# Patient Record
Sex: Male | Born: 1975 | Race: White | Hispanic: No | Marital: Single | Smoking: Never smoker
Health system: Southern US, Community
[De-identification: ages and names within clinical notes are randomized; demographics above are authoritative.]

## PROBLEM LIST (undated history)

## (undated) DIAGNOSIS — R51 Headache: Secondary | ICD-10-CM

## (undated) DIAGNOSIS — R519 Headache, unspecified: Secondary | ICD-10-CM

## (undated) DIAGNOSIS — D181 Lymphangioma, any site: Secondary | ICD-10-CM

## (undated) HISTORY — PX: NO PAST SURGERIES: SHX2092

---
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
## 2010-10-12 ENCOUNTER — Emergency Department: Payer: Self-pay | Admitting: Emergency Medicine

## 2012-09-27 ENCOUNTER — Emergency Department: Payer: Self-pay | Admitting: Emergency Medicine

## 2012-09-27 LAB — RAPID INFLUENZA A&B ANTIGENS

## 2013-04-20 ENCOUNTER — Ambulatory Visit: Payer: Self-pay | Admitting: Pediatrics

## 2013-09-16 ENCOUNTER — Emergency Department: Payer: Self-pay | Admitting: Emergency Medicine

## 2014-01-27 ENCOUNTER — Emergency Department: Payer: Self-pay | Admitting: Emergency Medicine

## 2016-12-06 ENCOUNTER — Other Ambulatory Visit: Payer: Self-pay | Admitting: Orthopedic Surgery

## 2016-12-06 DIAGNOSIS — M5137 Other intervertebral disc degeneration, lumbosacral region: Secondary | ICD-10-CM

## 2016-12-06 DIAGNOSIS — M5417 Radiculopathy, lumbosacral region: Secondary | ICD-10-CM

## 2016-12-06 DIAGNOSIS — M5136 Other intervertebral disc degeneration, lumbar region: Secondary | ICD-10-CM

## 2016-12-06 DIAGNOSIS — M5440 Lumbago with sciatica, unspecified side: Principal | ICD-10-CM

## 2016-12-06 DIAGNOSIS — M545 Low back pain, unspecified: Secondary | ICD-10-CM

## 2016-12-17 ENCOUNTER — Ambulatory Visit
Admission: RE | Admit: 2016-12-17 | Discharge: 2016-12-17 | Disposition: A | Discharge status: Home / Self Care | Payer: BLUE CROSS/BLUE SHIELD | Source: Ambulatory Visit | Attending: Orthopedic Surgery | Admitting: Orthopedic Surgery

## 2016-12-17 DIAGNOSIS — M5136 Other intervertebral disc degeneration, lumbar region: Secondary | ICD-10-CM

## 2016-12-17 DIAGNOSIS — M5417 Radiculopathy, lumbosacral region: Secondary | ICD-10-CM

## 2016-12-17 DIAGNOSIS — M5440 Lumbago with sciatica, unspecified side: Secondary | ICD-10-CM | POA: Insufficient documentation

## 2016-12-17 DIAGNOSIS — M5137 Other intervertebral disc degeneration, lumbosacral region: Secondary | ICD-10-CM

## 2016-12-17 DIAGNOSIS — M545 Low back pain, unspecified: Secondary | ICD-10-CM

## 2017-01-01 ENCOUNTER — Other Ambulatory Visit: Payer: Self-pay | Admitting: Orthopedic Surgery

## 2017-01-01 DIAGNOSIS — M5127 Other intervertebral disc displacement, lumbosacral region: Secondary | ICD-10-CM

## 2017-01-08 ENCOUNTER — Ambulatory Visit
Admission: RE | Admit: 2017-01-08 | Discharge: 2017-01-08 | Disposition: A | Discharge status: Home / Self Care | Payer: BLUE CROSS/BLUE SHIELD | Source: Ambulatory Visit | Attending: Orthopedic Surgery | Admitting: Orthopedic Surgery

## 2017-01-08 DIAGNOSIS — M5127 Other intervertebral disc displacement, lumbosacral region: Secondary | ICD-10-CM

## 2017-01-08 MED ORDER — METHYLPREDNISOLONE ACETATE 40 MG/ML INJ SUSP (RADIOLOG
120.0000 mg | Freq: Once | INTRAMUSCULAR | Status: AC
  Administered 2017-01-08: 120 mg via EPIDURAL

## 2017-01-08 MED ORDER — IOPAMIDOL (ISOVUE-M 200) INJECTION 41%
1.0000 mL | Freq: Once | INTRAMUSCULAR | Status: AC
  Administered 2017-01-08: 1 mL via EPIDURAL

## 2017-01-08 NOTE — Discharge Instructions (Signed)

## 2017-01-20 ENCOUNTER — Encounter
Admission: RE | Admit: 2017-01-20 | Discharge: 2017-01-20 | Disposition: A | Discharge status: Home / Self Care | Payer: BLUE CROSS/BLUE SHIELD | Source: Ambulatory Visit | Attending: Neurosurgery | Admitting: Neurosurgery

## 2017-01-20 DIAGNOSIS — M5116 Intervertebral disc disorders with radiculopathy, lumbar region: Secondary | ICD-10-CM | POA: Diagnosis not present

## 2017-01-20 DIAGNOSIS — Z791 Long term (current) use of non-steroidal anti-inflammatories (NSAID): Secondary | ICD-10-CM | POA: Diagnosis not present

## 2017-01-20 DIAGNOSIS — F329 Major depressive disorder, single episode, unspecified: Secondary | ICD-10-CM | POA: Diagnosis not present

## 2017-01-20 DIAGNOSIS — Z79899 Other long term (current) drug therapy: Secondary | ICD-10-CM | POA: Diagnosis not present

## 2017-01-20 DIAGNOSIS — G43909 Migraine, unspecified, not intractable, without status migrainosus: Secondary | ICD-10-CM | POA: Diagnosis not present

## 2017-01-20 DIAGNOSIS — J45909 Unspecified asthma, uncomplicated: Secondary | ICD-10-CM | POA: Diagnosis not present

## 2017-01-20 HISTORY — DX: Headache: R51

## 2017-01-20 HISTORY — DX: Headache, unspecified: R51.9

## 2017-01-20 HISTORY — DX: Lymphangioma, any site: D18.1

## 2017-01-20 LAB — URINALYSIS, ROUTINE W REFLEX MICROSCOPIC
BILIRUBIN URINE: NEGATIVE
GLUCOSE, UA: NEGATIVE mg/dL
HGB URINE DIPSTICK: NEGATIVE
KETONES UR: NEGATIVE mg/dL
Leukocytes, UA: NEGATIVE
Nitrite: NEGATIVE
PROTEIN: NEGATIVE mg/dL
Specific Gravity, Urine: 1.004 — ABNORMAL LOW (ref 1.005–1.030)
pH: 6 (ref 5.0–8.0)

## 2017-01-20 LAB — DIFFERENTIAL
BASOS ABS: 0 10*3/uL (ref 0–0.1)
BASOS PCT: 0 %
EOS ABS: 0.1 10*3/uL (ref 0–0.7)
EOS PCT: 2 %
Lymphocytes Relative: 35 %
Lymphs Abs: 2.5 10*3/uL (ref 1.0–3.6)
Monocytes Absolute: 0.7 10*3/uL (ref 0.2–1.0)
Monocytes Relative: 10 %
NEUTROS PCT: 53 %
Neutro Abs: 4 10*3/uL (ref 1.4–6.5)

## 2017-01-20 LAB — BASIC METABOLIC PANEL
ANION GAP: 8 (ref 5–15)
BUN: 12 mg/dL (ref 6–20)
CALCIUM: 9.7 mg/dL (ref 8.9–10.3)
CO2: 27 mmol/L (ref 22–32)
CREATININE: 0.81 mg/dL (ref 0.61–1.24)
Chloride: 105 mmol/L (ref 101–111)
GLUCOSE: 80 mg/dL (ref 65–99)
Potassium: 4 mmol/L (ref 3.5–5.1)
Sodium: 140 mmol/L (ref 135–145)

## 2017-01-20 LAB — CBC
HCT: 42.6 % (ref 40.0–52.0)
Hemoglobin: 14.4 g/dL (ref 13.0–18.0)
MCH: 30.3 pg (ref 26.0–34.0)
MCHC: 33.9 g/dL (ref 32.0–36.0)
MCV: 89.5 fL (ref 80.0–100.0)
Platelets: 283 10*3/uL (ref 150–440)
RBC: 4.76 MIL/uL (ref 4.40–5.90)
RDW: 13.8 % (ref 11.5–14.5)
WBC: 7.4 10*3/uL (ref 3.8–10.6)

## 2017-01-20 LAB — APTT: APTT: 37 s — AB (ref 24–36)

## 2017-01-20 LAB — SURGICAL PCR SCREEN
MRSA, PCR: NEGATIVE
STAPHYLOCOCCUS AUREUS: POSITIVE — AB

## 2017-01-20 LAB — PROTIME-INR
INR: 0.91
PROTHROMBIN TIME: 12.2 s (ref 11.4–15.2)

## 2017-01-20 NOTE — Patient Instructions (Signed)
  Your procedure is scheduled on:Wednesday March 28 , 2018. Report to Same Day Surgery. To find out your arrival time please call [phone] between 1PM - 3PM on Tuesday January 21, 2017 .  Remember: Instructions that are not followed completely may result in serious medical risk, up to and including death, or upon the discretion of your surgeon and anesthesiologist your surgery may need to be rescheduled.    _x___ 1. Do not eat food or drink liquids after midnight. No gum chewing or hard candies.     _x___ 2. No Alcohol for 24 hours before or after surgery.   ____ 3. Bring all medications with you on the day of surgery if instructed.    __x__ 4. Notify your doctor if there is any change in your medical condition     (cold, fever, infections).    _____ 5. No smoking 24 hours prior to surgery.     Do not wear jewelry, make-up, hairpins, clips or nail polish.  Do not wear lotions, powders, or perfumes.   Do not shave 48 hours prior to surgery. Men may shave face and neck.  Do not bring valuables to the hospital.    [facility] is not responsible for any belongings or valuables.               Contacts, dentures or bridgework may not be worn into surgery.  Leave your suitcase in the car. After surgery it may be brought to your room.  For patients admitted to the hospital, discharge time is determined by your treatment team.   Patients discharged the day of surgery will not be allowed to drive home.    Please read over the following fact sheets that you were given:   [facility] Preparing for Surgery  ____ Take these medicines the morning of surgery with A SIP OF WATER: NONE     ____ Fleet Enema (as directed)   _x___ Use CHG Soap as directed on instruction sheet  _x___ Use inhalers on the day of surgery and bring to hospital day of surgery  ____ Stop metformin 2 days prior to surgery    ____ Take 1/2 of usual insulin dose the night before surgery and none on the morning of  surgery.   ____ Stop Coumadin/Plavix/aspirin on does not apply.  _x___ Stop Anti-inflammatories such as Advil, Aleve, Ibuprofen, Motrin, Naproxen, meloxicam (MOBIC), Naprosyn, Goodies powders or aspirin products. OK to  take Tylenol or Tramadol for pain.   ____ Stop supplements until after surgery.    ____ Bring C-Pap to the hospital.

## 2017-01-21 NOTE — Pre-Procedure Instructions (Signed)
PTT and "positive" staph aureus results sent to Dr. Izora Ribas for review.  Asked if wanted any treatment for the "positive" staph aureus?

## 2017-01-21 NOTE — Pre-Procedure Instructions (Signed)
Received message from [facility] at Dr. Sedalia Muta office, no further treatment is needed regarding positive Staph. Aureus on nasal swab.

## 2017-01-22 ENCOUNTER — Ambulatory Visit: Payer: BLUE CROSS/BLUE SHIELD | Admitting: Certified Registered Nurse Anesthetist

## 2017-01-22 ENCOUNTER — Encounter
Admission: RE | Disposition: A | Discharge status: Home / Self Care | Payer: Self-pay | Source: Ambulatory Visit | Attending: Neurosurgery

## 2017-01-22 ENCOUNTER — Encounter: Payer: Self-pay | Admitting: Anesthesiology

## 2017-01-22 ENCOUNTER — Ambulatory Visit
Admission: RE | Admit: 2017-01-22 | Discharge: 2017-01-22 | Disposition: A | Discharge status: Home / Self Care | Payer: BLUE CROSS/BLUE SHIELD | Source: Ambulatory Visit | Attending: Neurosurgery | Admitting: Neurosurgery

## 2017-01-22 ENCOUNTER — Ambulatory Visit: Payer: BLUE CROSS/BLUE SHIELD

## 2017-01-22 DIAGNOSIS — M5116 Intervertebral disc disorders with radiculopathy, lumbar region: Secondary | ICD-10-CM | POA: Diagnosis not present

## 2017-01-22 DIAGNOSIS — G43909 Migraine, unspecified, not intractable, without status migrainosus: Secondary | ICD-10-CM | POA: Insufficient documentation

## 2017-01-22 DIAGNOSIS — Z791 Long term (current) use of non-steroidal anti-inflammatories (NSAID): Secondary | ICD-10-CM | POA: Insufficient documentation

## 2017-01-22 DIAGNOSIS — J45909 Unspecified asthma, uncomplicated: Secondary | ICD-10-CM | POA: Insufficient documentation

## 2017-01-22 DIAGNOSIS — F329 Major depressive disorder, single episode, unspecified: Secondary | ICD-10-CM | POA: Insufficient documentation

## 2017-01-22 DIAGNOSIS — Z79899 Other long term (current) drug therapy: Secondary | ICD-10-CM | POA: Insufficient documentation

## 2017-01-22 DIAGNOSIS — Z419 Encounter for procedure for purposes other than remedying health state, unspecified: Secondary | ICD-10-CM

## 2017-01-22 HISTORY — PX: LUMBAR LAMINECTOMY/DECOMPRESSION MICRODISCECTOMY: SHX5026

## 2017-01-22 SURGERY — LUMBAR LAMINECTOMY/DECOMPRESSION MICRODISCECTOMY 1 LEVEL
Anesthesia: General | Site: Back | Laterality: Left

## 2017-01-22 MED ORDER — FENTANYL CITRATE (PF) 100 MCG/2ML IJ SOLN
INTRAMUSCULAR | Status: DC | PRN
  Administered 2017-01-22: 100 ug via INTRAVENOUS

## 2017-01-22 MED ORDER — ACETAMINOPHEN 10 MG/ML IV SOLN
INTRAVENOUS | Status: DC | PRN
  Administered 2017-01-22: 1000 mg via INTRAVENOUS

## 2017-01-22 MED ORDER — FAMOTIDINE 20 MG PO TABS
ORAL_TABLET | ORAL | Status: AC
  Administered 2017-01-22: 20 mg via ORAL
  Filled 2017-01-22: qty 1

## 2017-01-22 MED ORDER — METHOCARBAMOL 500 MG PO TABS: 500.0000 mg | ORAL_TABLET | Freq: Four times a day (QID) | ORAL | 0 refills | Status: DC

## 2017-01-22 MED ORDER — ONDANSETRON HCL 4 MG/2ML IJ SOLN
4.0000 mg | Freq: Once | INTRAMUSCULAR | Status: AC
Start: 2017-01-22 — End: 2017-01-22
  Administered 2017-01-22: 4 mg via INTRAVENOUS

## 2017-01-22 MED ORDER — SUGAMMADEX SODIUM 200 MG/2ML IV SOLN
INTRAVENOUS | Status: AC
  Filled 2017-01-22: qty 2

## 2017-01-22 MED ORDER — LIDOCAINE HCL (CARDIAC) 20 MG/ML IV SOLN
INTRAVENOUS | Status: DC | PRN
  Administered 2017-01-22: 100 mg via INTRAVENOUS

## 2017-01-22 MED ORDER — DEXAMETHASONE SODIUM PHOSPHATE 10 MG/ML IJ SOLN
10.0000 mg | Freq: Once | INTRAMUSCULAR | Status: AC
  Administered 2017-01-22: 10 mg via INTRAVENOUS

## 2017-01-22 MED ORDER — OXYCODONE-ACETAMINOPHEN 5-325 MG PO TABS: 1.0000 | ORAL_TABLET | ORAL | 0 refills | Status: DC | PRN

## 2017-01-22 MED ORDER — BUPIVACAINE-EPINEPHRINE (PF) 0.5% -1:200000 IJ SOLN
INTRAMUSCULAR | Status: DC | PRN
  Administered 2017-01-22: 10 mL

## 2017-01-22 MED ORDER — PROMETHAZINE HCL 25 MG/ML IJ SOLN
INTRAMUSCULAR | Status: AC
  Filled 2017-01-22: qty 1

## 2017-01-22 MED ORDER — OXYCODONE HCL 5 MG PO TABS
ORAL_TABLET | ORAL | Status: AC
  Administered 2017-01-22: 5 mg via ORAL
  Filled 2017-01-22: qty 1

## 2017-01-22 MED ORDER — ONDANSETRON HCL 4 MG/2ML IJ SOLN
INTRAMUSCULAR | Status: AC
  Filled 2017-01-22: qty 2

## 2017-01-22 MED ORDER — SUCCINYLCHOLINE CHLORIDE 20 MG/ML IJ SOLN
INTRAMUSCULAR | Status: AC
  Filled 2017-01-22: qty 1

## 2017-01-22 MED ORDER — DEXAMETHASONE SODIUM PHOSPHATE 10 MG/ML IJ SOLN
INTRAMUSCULAR | Status: AC
  Filled 2017-01-22: qty 1

## 2017-01-22 MED ORDER — BUPIVACAINE LIPOSOME 1.3 % IJ SUSP
INTRAMUSCULAR | Status: DC | PRN
  Administered 2017-01-22: 20 mL

## 2017-01-22 MED ORDER — BACITRACIN 50000 UNITS IM SOLR
INTRAMUSCULAR | Status: DC | PRN
  Administered 2017-01-22: 5000 [IU]

## 2017-01-22 MED ORDER — GLYCOPYRROLATE 0.2 MG/ML IJ SOLN
INTRAMUSCULAR | Status: AC
  Filled 2017-01-22: qty 1

## 2017-01-22 MED ORDER — OXYCODONE HCL 5 MG/5ML PO SOLN: 5.0000 mg | Freq: Once | ORAL | Status: AC | PRN

## 2017-01-22 MED ORDER — CEFAZOLIN SODIUM-DEXTROSE 2-4 GM/100ML-% IV SOLN
INTRAVENOUS | Status: AC
  Filled 2017-01-22: qty 100

## 2017-01-22 MED ORDER — METHYLPREDNISOLONE ACETATE 40 MG/ML IJ SUSP
INTRAMUSCULAR | Status: DC | PRN
  Administered 2017-01-22: 40 mg

## 2017-01-22 MED ORDER — MIDAZOLAM HCL 2 MG/2ML IJ SOLN
INTRAMUSCULAR | Status: AC
  Filled 2017-01-22: qty 2

## 2017-01-22 MED ORDER — METHOCARBAMOL 500 MG PO TABS
500.0000 mg | ORAL_TABLET | Freq: Once | ORAL | Status: AC
  Administered 2017-01-22: 500 mg via ORAL
  Filled 2017-01-22: qty 1

## 2017-01-22 MED ORDER — GELATIN ABSORBABLE 12-7 MM EX MISC
CUTANEOUS | Status: AC
  Filled 2017-01-22: qty 1

## 2017-01-22 MED ORDER — PHENYLEPHRINE HCL 10 MG/ML IJ SOLN
INTRAMUSCULAR | Status: AC
  Filled 2017-01-22: qty 1

## 2017-01-22 MED ORDER — ROCURONIUM BROMIDE 50 MG/5ML IV SOLN
INTRAVENOUS | Status: AC
  Filled 2017-01-22: qty 1

## 2017-01-22 MED ORDER — BUPIVACAINE LIPOSOME 1.3 % IJ SUSP
INTRAMUSCULAR | Status: AC
  Filled 2017-01-22: qty 20

## 2017-01-22 MED ORDER — OXYCODONE HCL 5 MG PO TABS
5.0000 mg | ORAL_TABLET | Freq: Once | ORAL | Status: AC | PRN
  Administered 2017-01-22: 5 mg via ORAL

## 2017-01-22 MED ORDER — GELATIN ABSORBABLE 12-7 MM EX MISC
CUTANEOUS | Status: DC | PRN
  Administered 2017-01-22: 1

## 2017-01-22 MED ORDER — FENTANYL CITRATE (PF) 250 MCG/5ML IJ SOLN
INTRAMUSCULAR | Status: AC
  Filled 2017-01-22: qty 5

## 2017-01-22 MED ORDER — SEVOFLURANE IN SOLN
RESPIRATORY_TRACT | Status: AC
  Filled 2017-01-22: qty 250

## 2017-01-22 MED ORDER — FENTANYL CITRATE (PF) 100 MCG/2ML IJ SOLN
INTRAMUSCULAR | Status: AC
  Administered 2017-01-22: 25 ug via INTRAVENOUS
  Filled 2017-01-22: qty 2

## 2017-01-22 MED ORDER — ONDANSETRON HCL 4 MG/2ML IJ SOLN
INTRAMUSCULAR | Status: DC | PRN
  Administered 2017-01-22: 4 mg via INTRAVENOUS

## 2017-01-22 MED ORDER — BACITRACIN 50000 UNITS IM SOLR
INTRAMUSCULAR | Status: AC
  Filled 2017-01-22: qty 1

## 2017-01-22 MED ORDER — THROMBIN 5000 UNITS EX SOLR
CUTANEOUS | Status: DC | PRN
  Administered 2017-01-22: 5000 [IU] via TOPICAL

## 2017-01-22 MED ORDER — PROPOFOL 10 MG/ML IV BOLUS
INTRAVENOUS | Status: DC | PRN
  Administered 2017-01-22: 160 mg via INTRAVENOUS

## 2017-01-22 MED ORDER — CEFAZOLIN SODIUM-DEXTROSE 2-3 GM-% IV SOLR
2.0000 g | INTRAVENOUS | Status: AC
  Administered 2017-01-22: 2 g via INTRAVENOUS
  Filled 2017-01-22: qty 50

## 2017-01-22 MED ORDER — SUCCINYLCHOLINE CHLORIDE 20 MG/ML IJ SOLN
INTRAMUSCULAR | Status: DC | PRN
  Administered 2017-01-22: 100 mg via INTRAVENOUS

## 2017-01-22 MED ORDER — THROMBIN 5000 UNITS EX SOLR
CUTANEOUS | Status: AC
  Filled 2017-01-22: qty 10000

## 2017-01-22 MED ORDER — LACTATED RINGERS IV SOLN
INTRAVENOUS | Status: DC
  Administered 2017-01-22 (×2): via INTRAVENOUS

## 2017-01-22 MED ORDER — MIDAZOLAM HCL 2 MG/2ML IJ SOLN
INTRAMUSCULAR | Status: DC | PRN
  Administered 2017-01-22: 2 mg via INTRAVENOUS

## 2017-01-22 MED ORDER — SODIUM CHLORIDE 0.9 % IJ SOLN
INTRAMUSCULAR | Status: AC
  Filled 2017-01-22: qty 10

## 2017-01-22 MED ORDER — FENTANYL CITRATE (PF) 100 MCG/2ML IJ SOLN
25.0000 ug | INTRAMUSCULAR | Status: DC | PRN
  Administered 2017-01-22 (×4): 25 ug via INTRAVENOUS

## 2017-01-22 MED ORDER — LIDOCAINE HCL (PF) 2 % IJ SOLN
INTRAMUSCULAR | Status: AC
  Filled 2017-01-22: qty 2

## 2017-01-22 MED ORDER — FAMOTIDINE 20 MG PO TABS
20.0000 mg | ORAL_TABLET | Freq: Once | ORAL | Status: AC
  Administered 2017-01-22: 20 mg via ORAL

## 2017-01-22 MED ORDER — BUPIVACAINE-EPINEPHRINE (PF) 0.5% -1:200000 IJ SOLN
INTRAMUSCULAR | Status: AC
  Filled 2017-01-22: qty 30

## 2017-01-22 MED ORDER — PROMETHAZINE HCL 25 MG/ML IJ SOLN: 12.5000 mg | Freq: Once | INTRAMUSCULAR | Status: DC

## 2017-01-22 MED ORDER — DEXAMETHASONE SODIUM PHOSPHATE 10 MG/ML IJ SOLN
INTRAMUSCULAR | Status: DC | PRN
  Administered 2017-01-22: 8 mg via INTRAVENOUS

## 2017-01-22 MED ORDER — METHYLPREDNISOLONE ACETATE 40 MG/ML IJ SUSP
INTRAMUSCULAR | Status: AC
  Filled 2017-01-22: qty 1

## 2017-01-22 MED ORDER — SUGAMMADEX SODIUM 500 MG/5ML IV SOLN
INTRAVENOUS | Status: DC | PRN
  Administered 2017-01-22: 157.8 mg via INTRAVENOUS

## 2017-01-22 MED ORDER — ROCURONIUM BROMIDE 100 MG/10ML IV SOLN
INTRAVENOUS | Status: DC | PRN
  Administered 2017-01-22 (×2): 5 mg via INTRAVENOUS
  Administered 2017-01-22: 40 mg via INTRAVENOUS
  Administered 2017-01-22: 10 mg via INTRAVENOUS

## 2017-01-22 MED ORDER — ACETAMINOPHEN 10 MG/ML IV SOLN
INTRAVENOUS | Status: AC
  Filled 2017-01-22: qty 100

## 2017-01-22 MED ORDER — SODIUM CHLORIDE FLUSH 0.9 % IV SOLN
INTRAVENOUS | Status: AC
  Filled 2017-01-22: qty 10

## 2017-01-22 MED ORDER — PROPOFOL 10 MG/ML IV BOLUS
INTRAVENOUS | Status: AC
  Filled 2017-01-22: qty 20

## 2017-01-22 MED ORDER — METHYLPREDNISOLONE 4 MG PO TBPK: ORAL_TABLET | ORAL | 0 refills | Status: DC

## 2017-01-22 SURGICAL SUPPLY — 62 items
BAND RUBBER 3X1/6 TAN STRL (MISCELLANEOUS) IMPLANT
BLADE BOVIE TIP EXT 4 (BLADE) ×3 IMPLANT
BUR NEURO DRILL SOFT 3.0X3.8M (BURR) ×3 IMPLANT
CANISTER SUCT 1200ML W/VALVE (MISCELLANEOUS) ×3 IMPLANT
CHLORAPREP W/TINT 26ML (MISCELLANEOUS) ×6 IMPLANT
CNTNR SPEC 2.5X3XGRAD LEK (MISCELLANEOUS) ×1
CONT SPEC 4OZ STER OR WHT (MISCELLANEOUS) ×2
CONTAINER SPEC 2.5X3XGRAD LEK (MISCELLANEOUS) ×1 IMPLANT
COUNTER NEEDLE 20/40 LG (NEEDLE) ×3 IMPLANT
COVER LIGHT HANDLE STERIS (MISCELLANEOUS) ×6 IMPLANT
CUP MEDICINE 2OZ PLAST GRAD ST (MISCELLANEOUS) ×6 IMPLANT
DERMABOND ADVANCED (GAUZE/BANDAGES/DRESSINGS) ×2
DERMABOND ADVANCED .7 DNX12 (GAUZE/BANDAGES/DRESSINGS) ×1 IMPLANT
DRAPE C-ARM 42X72 X-RAY (DRAPES) ×6 IMPLANT
DRAPE LAPAROTOMY 100X77 ABD (DRAPES) ×3 IMPLANT
DRAPE MICROSCOPE SPINE 48X150 (DRAPES) ×2 IMPLANT
DRAPE POUCH INSTRU U-SHP 10X18 (DRAPES) ×3 IMPLANT
DRAPE SURG 17X11 SM STRL (DRAPES) ×12 IMPLANT
DRSG TEGADERM 4X4.75 (GAUZE/BANDAGES/DRESSINGS) IMPLANT
DRSG TELFA 4X3 1S NADH ST (GAUZE/BANDAGES/DRESSINGS) IMPLANT
DURASEAL APPLICATOR TIP (TIP) IMPLANT
DURASEAL SPINE SEALANT 3ML (MISCELLANEOUS) IMPLANT
ELECT CAUTERY BLADE TIP 2.5 (TIP) ×3
ELECT EZSTD 165MM 6.5IN (MISCELLANEOUS)
ELECTRODE CAUTERY BLDE TIP 2.5 (TIP) ×1 IMPLANT
ELECTRODE EZSTD 165MM 6.5IN (MISCELLANEOUS) IMPLANT
FRAME EYE SHIELD (PROTECTIVE WEAR) ×3 IMPLANT
GLOVE SURG SYN 8.5  E (GLOVE) ×6
GLOVE SURG SYN 8.5 E (GLOVE) ×3 IMPLANT
GOWN STRL REUS W/ TWL LRG LVL3 (GOWN DISPOSABLE) ×1 IMPLANT
GOWN STRL REUS W/ TWL XL LVL3 (GOWN DISPOSABLE) ×1 IMPLANT
GOWN STRL REUS W/TWL LRG LVL3 (GOWN DISPOSABLE) ×2
GOWN STRL REUS W/TWL XL LVL3 (GOWN DISPOSABLE) ×2
GRADUATE 1200CC STRL 31836 (MISCELLANEOUS) ×3 IMPLANT
KIT SPINAL PRONEVIEW (KITS) ×2 IMPLANT
KNIFE BAYONET SHORT DISCETOMY (MISCELLANEOUS) IMPLANT
MARKER SKIN DUAL TIP RULER LAB (MISCELLANEOUS) ×6 IMPLANT
NDL SAFETY ECLIPSE 18X1.5 (NEEDLE) ×1 IMPLANT
NEEDLE HYPO 18GX1.5 SHARP (NEEDLE) ×2
NEEDLE HYPO 22GX1.5 SAFETY (NEEDLE) ×3 IMPLANT
NS IRRIG 1000ML POUR BTL (IV SOLUTION) ×3 IMPLANT
PACK LAMINECTOMY NEURO (CUSTOM PROCEDURE TRAY) ×3 IMPLANT
PAD ARMBOARD 7.5X6 YLW CONV (MISCELLANEOUS) ×3 IMPLANT
PATTIES SURGICAL .5X1.5 (GAUZE/BANDAGES/DRESSINGS) ×3 IMPLANT
SPOGE SURGIFLO 8M (HEMOSTASIS) ×2
SPONGE SURGIFLO 8M (HEMOSTASIS) ×1 IMPLANT
STAPLER SKIN PROX 35W (STAPLE) IMPLANT
SUT DVC VLOC 3-0 CL 6 P-12 (SUTURE) ×6 IMPLANT
SUT NURALON 4 0 TR CR/8 (SUTURE) IMPLANT
SUT VIC AB 0 CT1 27 (SUTURE) ×6
SUT VIC AB 0 CT1 27XCR 8 STRN (SUTURE) ×3 IMPLANT
SUT VIC AB 2-0 CT1 18 (SUTURE) ×3 IMPLANT
SUT VIC AB 3-0 SH 27 (SUTURE) ×2
SUT VIC AB 3-0 SH 27X BRD (SUTURE) ×1 IMPLANT
SUT VICRYL 0 UR6 27IN ABS (SUTURE) ×3 IMPLANT
SYR 20CC LL (SYRINGE) ×3 IMPLANT
SYRINGE 10CC LL (SYRINGE) ×3 IMPLANT
TOWEL OR 17X26 4PK STRL BLUE (TOWEL DISPOSABLE) ×6 IMPLANT
TUBE MATRX SPINL 18MM 6CM DISP (INSTRUMENTS) ×2
TUBE METRX SPINAL 18X6 DISP (INSTRUMENTS) ×1 IMPLANT
TUBING CONNECTING 10 (TUBING) ×2 IMPLANT
TUBING CONNECTING 10' (TUBING) ×1

## 2017-01-22 NOTE — Discharge Instructions (Addendum)
Your surgeon has performed an operation on your lumbar spine (low back) to relieve pressure on one or more nerves. Many times, patients feel better immediately after surgery and can overdo it. Even if you feel well, it is important that you follow these activity guidelines. If you do not let your back heal properly from the surgery, you can increase the chance of a disc herniation and return of your symptoms. The following are instructions to help in your recovery once you have been discharged from the hospital.  * Do not take anti-inflammatory medications for 5 days after surgery (naproxen [Aleve], ibuprofen [Advil, Motrin], celecoxib [Celebrex], etc.)  Activity    No bending, lifting, or twisting (BLT). Avoid lifting objects heavier than 10 pounds (gallon milk jug).  Where possible, avoid household activities that involve lifting, bending, pushing, or pulling such as laundry, vacuuming, grocery shopping, and childcare. Try to arrange for help from friends and family for these activities while your back heals.  Increase physical activity slowly as tolerated.  Taking short walks is encouraged, but avoid strenuous exercise. Do not jog, run, bicycle, lift weights, or participate in any other exercises unless specifically allowed by your doctor. Avoid prolonged sitting, including car rides.  Talk to your doctor before resuming sexual activity.  You should not drive until cleared by your doctor.  Until released by your doctor, you should not return to work or school.  You should rest at home and let your body heal.   You may shower three days after your surgery.  After showering, lightly dab your incision dry. Do not take a tub bath or go swimming until approved by your doctor at your follow-up appointment.  If you smoke, we strongly recommend that you quit.  Smoking has been proven to interfere with normal healing in your back and will dramatically reduce the success rate of your surgery. Please  contact [facility] (800-QUIT-NOW) and use the resources at www.[facility].com for assistance in stopping smoking.  Surgical Incision   If you have a dressing on your incision, you may remove it two days after your surgery. Keep your incision area clean and dry.  If you have staples or stitches on your incision, you should have a follow up scheduled for removal. If you do not have staples or stitches, you will have steri-strips (small pieces of surgical tape) or Dermabond glue. The steri-strips/glue should begin to peel away within about a week (it is fine if the steri-strips fall off before then). If the strips are still in place one week after your surgery, you may gently remove them.  Diet            You may return to your usual diet. Be sure to stay hydrated.  When to Contact us  Although your surgery and recovery will likely be uneventful, you may have some residual numbness, aches, and pains in your back and/or legs. This is normal and should improve in the next few weeks.  However, should you experience any of the following, contact us immediately:  New numbness or weakness  Pain that is progressively getting worse, and is not relieved by your pain medications or rest  Bleeding, redness, swelling, pain, or drainage from surgical incision  Chills or flu-like symptoms  Fever greater than 101.0 F (38.3 C)  Problems with bowel or bladder functions  Difficulty breathing or shortness of breath  Warmth, tenderness, or swelling in your calf  Contact Information  During office hours (Monday-Friday 9 am to 5  pm), please call your physician at [phone]  After hours and weekends, please call the [place] Operator at [phone] and ask for the Neurosurgery Resident On Call   For a life-threatening emergency, call [place]   1) The drugs that you were given will stay in your system until tomorrow so for the next 24 hours you should  not:  A) Drive an automobile B) Make any legal decisions C) Drink any alcoholic beverage   2) You may resume regular meals tomorrow.  Today it is better to start with liquids and gradually work up to solid foods.  You may eat anything you prefer, but it is better to start with liquids, then soup and crackers, and gradually work up to solid foods.   3) Please notify your doctor immediately if you have any unusual bleeding, trouble breathing, redness and pain at the surgery site, drainage, fever, or pain not relieved by medication.    4) Additional Instructions:        Please contact your physician with any problems or Same Day Surgery at [phone], Monday through Friday 6 am to 4 pm, or [place] at [facility] Building number at [phone].

## 2017-01-22 NOTE — Progress Notes (Signed)
Patient ambulated to bathroom, voided and walked back to room without difficulty.

## 2017-01-22 NOTE — Anesthesia Procedure Notes (Signed)
Procedure Name: Intubation Performed by: Demetrius Charity Pre-anesthesia Checklist: Patient identified, Patient being monitored, Timeout performed, Emergency Drugs available and Suction available Patient Re-evaluated:Patient Re-evaluated prior to inductionOxygen Delivery Method: Circle system utilized Preoxygenation: Pre-oxygenation with 100% oxygen Intubation Type: IV induction Ventilation: Mask ventilation without difficulty Laryngoscope Size: 3 and McGraph Grade View: Grade II Tube type: Oral Tube size: 7.5 mm Number of attempts: 1 Airway Equipment and Method: Video-laryngoscopy Placement Confirmation: ETT inserted through vocal cords under direct vision,  positive ETCO2 and breath sounds checked- equal and bilateral Secured at: 23 cm Tube secured with: Tape Dental Injury: Teeth and Oropharynx as per pre-operative assessment

## 2017-01-22 NOTE — H&P (Signed)
I have reviewed and confirmed my history and physical from 01/16/2017 with no additions or changes. Plan for left L5/S1 microdiscectomy.  Risks and benefits reviewed.

## 2017-01-22 NOTE — Progress Notes (Signed)
Patient complained of nausea, called anesthesia and order received for zofran and given at 1130. At 1200 patient continued to complain of nausea. Lights dimmed, cool wahs cloth given to patient for forehead and peppermint essential oil given on q tip. Anesthesia again notified and ordered phenergan 12.5 IV. Once medication pulled up and ready to admin patient stated the cool rag and peppermint was helping and wanted to hold off on phenergan. Informed patient if he felt nauseated again to let us know and we can give him phenergan. Patient verbalized understanding.

## 2017-01-22 NOTE — Op Note (Signed)
Indications: left S1 radiculopathy  Findings: diffuse disc bulge with calcification  Preoperative Diagnosis: Lumbar radiculopathy Postoperative Diagnosis: same   EBL: 25 ml IVF: 1100 ml Drains: none Disposition: Extubated and Stable to PACU Complications: none  No foley catheter was placed.   Preoperative Note:   Risks of surgery discussed include: infection, bleeding, stroke, coma, death, paralysis, CSF leak, nerve/spinal cord injury, numbness, tingling, weakness, complex regional pain syndrome, recurrent stenosis and/or disc herniation, vascular injury, development of instability, neck/back pain, need for further surgery, persistent symptoms, development of deformity, and the risks of anesthesia. They understood these risks and have agreed to proceed.  Operative Note:    The patient was then brought from the preoperative center with intravenous access established.  The patient underwent general anesthesia and endotracheal tube intubation, and was then rotated on the [place] rail top where all pressure points were appropriately padded.  The skin was then thoroughly cleansed.  Perioperative antibiotic prophylaxis was administered.  Sterile prep and drapes were then applied and a timeout was then observed.  C-arm was brought into the field under sterile conditions, and the L5-S1 disc space identified and marked with an incision on the left 1cm lateral to midline. Note that he has a significantly prominent S1-2 disc as noted on his MRI.  Once this was complete a 2 cm incision was opened with the use of a #10 blade knife.  The metrics tubes were sequentially advanced under lateral fluoroscopy until a 18 x 60 mm Metrix tube was placed over the facet and lamina and secured to the bed.    The microscope was then sterilely brought into the field and muscle creep was hemostased with a bipolar and resected with a pituitary rongeur.  A Bovie extender was then used to expose the spinous process and  lamina.  Careful attention was placed to not violate the facet capsule. A 3 mm matchstick drill bit was then used to make a hemi-laminotomy trough until the ligamentum flavum was exposed.  This was extended to the base of the spinous process.  Once this was complete and the underlying ligamentum flavum was visualized this was dissected with an up angle curette and resected with a #2 and #3 mm biting Kerrison.  The laminotomy opening was also expanded in similar fashion and hemostasis was obtained with Surgifoam and a patty as well as bone wax.  The rostral aspect of the caudal level of the lamina was also resected with a #2 biting Kerrison effort to further enhance exposure.  Once the underlying dura was visualized a Penfield 4 was then used to dissect and expose the traversing nerve root.  Once this was identified a nerve root retractor was used to mobilize this medially.  The venous plexus was hemostased with Surgifoam and light bipolar use.  The sharp scissors were then used to make a small annulotomy within the disc space and disc space contents were noted to to come through the annulus.    The disc herniation was identified and dissected free using a balltip probe. The pituitary rongeur was used to remove the extruded disc fragments. There were areas of adhesion that were dissected free with microinstruments. A portion of calcification was removed with the downpushing curette. Once the thecal sac and nerve root were noted to be relaxed and under less tension the ball-tipped feeler was passed along the foramen distally to to ensure no residual compression was noted.  With none noted, attention was then turned to closure.  A Depo-Medrol soaked Gelfoam pledget was placed along the nerve root for 2 minutes and removed.  The area was irrigated. The tube system was then removed under microscopic visualization and hemostasis was obtained with a bipolar.    The fascial layer was reapproximated with the use of a  0 Vicryl suture.  Subcutaneous tissue layer was reapproximated using 2-0 Vicryl suture.  3-0 monocryl was used on the skin. The skin was then cleansed and Dermabond was used to close the skin opening.  Patient was then rotated back to the preoperative bed awakened from anesthesia and taken to recovery all counts are correct in this case.   I performed the entire surgery.  Meade Maw MD

## 2017-01-22 NOTE — Progress Notes (Signed)
Shortly after returning from bathroom patient had severe pain and numbness in his lower back and down in left leg. Assisted patient to standing per patient request and it helped with the numbness, sat patient back down and palced in prone position, which helped initally but then pain continued to increase. Notified Dr. Izora Ribas and he came and saw patient in post op. Ordered dexamethasone IV which was given to patient and patient stated he had some relief soon after but it has returned shortly after. MD to return and reassess patient after some time. Patient seems to be able to rest in a semi prone position in recliner at this time.

## 2017-01-22 NOTE — Anesthesia Post-op Follow-up Note (Cosign Needed)
Anesthesia QCDR form completed.        

## 2017-01-22 NOTE — Transfer of Care (Signed)
Immediate Anesthesia Transfer of Care Note  Patient: Martin Livingston  Procedure(s) Performed: Procedure(s): left L5-S1 microdiscectomy (Left)  Patient Location: PACU  Anesthesia Type:General  Level of Consciousness: awake and alert   Airway & Oxygen Therapy: Patient Spontanous Breathing and Patient connected to face mask oxygen  Post-op Assessment: Report given to RN and Post -op Vital signs reviewed and stable  Post vital signs: Reviewed and stable  Last Vitals:  Vitals:   01/22/17 0613  BP: 117/87  Pulse: 68  Resp: 12  Temp: 36.4 C    Last Pain:  Vitals:   01/22/17 0613  TempSrc: Temporal  PainSc: 5          Complications: No apparent anesthesia complications

## 2017-01-22 NOTE — Anesthesia Postprocedure Evaluation (Signed)
Anesthesia Post Note  Patient: Quenten Nawaz  Procedure(s) Performed: Procedure(s) (LRB): left L5-S1 microdiscectomy (Left)  Patient location during evaluation: PACU Anesthesia Type: General Level of consciousness: awake and alert Pain management: pain level controlled Vital Signs Assessment: post-procedure vital signs reviewed and stable Respiratory status: spontaneous breathing, nonlabored ventilation, respiratory function stable and patient connected to nasal cannula oxygen Cardiovascular status: blood pressure returned to baseline and stable Postop Assessment: no signs of nausea or vomiting Anesthetic complications: no     Last Vitals:  Vitals:   01/22/17 1050 01/22/17 1104  BP: 132/87 125/87  Pulse: 84 79  Resp: 13 18  Temp: 37 C 36.2 C    Last Pain:  Vitals:   01/22/17 1104  TempSrc: Tympanic  PainSc: 6                  Broadus John K Piscitello

## 2017-01-22 NOTE — Anesthesia Preprocedure Evaluation (Signed)
Anesthesia Evaluation  Patient identified by MRN, date of birth, ID band Patient awake    Reviewed: Allergy & Precautions, H&P , NPO status , Patient's Chart, lab work & pertinent test results  Airway Mallampati: III  TM Distance: <3 FB Neck ROM: full    Dental  (+) Poor Dentition, Caps, Chipped   Pulmonary neg shortness of breath, asthma ,    Pulmonary exam normal breath sounds clear to auscultation       Cardiovascular Exercise Tolerance: Good (-) angina(-) Past MI and (-) DOE negative cardio ROS Normal cardiovascular exam Rhythm:regular Rate:Normal     Neuro/Psych  Headaches, negative psych ROS   GI/Hepatic negative GI ROS, Neg liver ROS, neg GERD  ,  Endo/Other  negative endocrine ROS  Renal/GU      Musculoskeletal   Abdominal   Peds  Hematology negative hematology ROS (+)   Anesthesia Other Findings Past Medical History: No date: 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  Past Surgical History: No date: NO PAST SURGERIES     Reproductive/Obstetrics negative OB ROS                             Anesthesia Physical Anesthesia Plan  ASA: III  Anesthesia Plan: General ETT   Post-op Pain Management:    Induction:   Airway Management Planned:   Additional Equipment:   Intra-op Plan:   Post-operative Plan:   Informed Consent: I have reviewed the patients History and Physical, chart, labs and discussed the procedure including the risks, benefits and alternatives for the proposed anesthesia with the patient or authorized representative who has indicated his/her understanding and acceptance.   Dental Advisory Given  Plan Discussed with: Anesthesiologist, CRNA and Surgeon  Anesthesia Plan Comments:         Anesthesia Quick Evaluation

## 2017-01-23 ENCOUNTER — Encounter: Payer: Self-pay | Admitting: Neurosurgery

## 2017-02-12 ENCOUNTER — Other Ambulatory Visit: Payer: Self-pay | Admitting: Neurosurgery

## 2017-02-12 DIAGNOSIS — M5416 Radiculopathy, lumbar region: Secondary | ICD-10-CM

## 2017-02-14 ENCOUNTER — Ambulatory Visit
Admission: RE | Admit: 2017-02-14 | Discharge: 2017-02-14 | Disposition: A | Discharge status: Home / Self Care | Payer: BLUE CROSS/BLUE SHIELD | Source: Ambulatory Visit | Attending: Neurosurgery | Admitting: Neurosurgery

## 2017-02-14 DIAGNOSIS — M5416 Radiculopathy, lumbar region: Secondary | ICD-10-CM | POA: Diagnosis present

## 2017-02-14 MED ORDER — GADOBENATE DIMEGLUMINE 529 MG/ML IV SOLN
16.0000 mL | Freq: Once | INTRAVENOUS | Status: AC | PRN
  Administered 2017-02-14: 16 mL via INTRAVENOUS

## 2017-12-16 IMAGING — RF DG C-ARM 61-120 MIN
1 series · 1 of 1 positions shown · non-contrast
Comparison: MRI lumbar spine[REDACTED].

CLINICAL DATA: Lumbar spine surgery.

EXAM:
OPERATIVE LUMBAR SPINE 1 VIEW(S)

[Series 1: run · 1 of 1 slices shown]
[im 1/1]
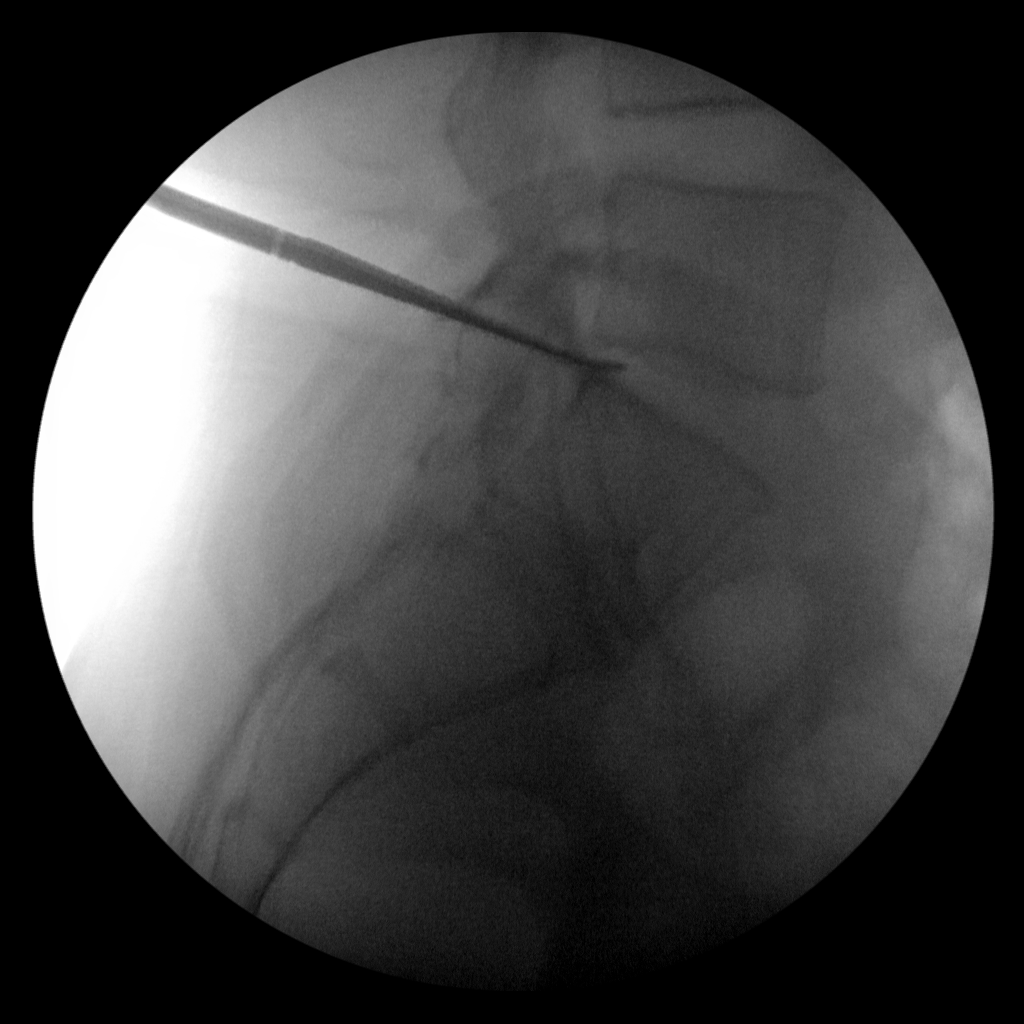

[1 of 1 positions shown; findings below may reference images not displayed]

FINDINGS: Transitional anatomy is present. A single intraoperative lateral
view lumbar spine demonstrates a surgical probe directed the L5-S1
disc space. This is the level of the broad-based disc protrusion.
IMPRESSION: 1. Intraoperative localization of L5-S1.

## 2018-03-23 ENCOUNTER — Encounter: Payer: Self-pay | Admitting: Emergency Medicine

## 2018-03-23 ENCOUNTER — Ambulatory Visit (INDEPENDENT_AMBULATORY_CARE_PROVIDER_SITE_OTHER): Payer: BLUE CROSS/BLUE SHIELD

## 2018-03-23 ENCOUNTER — Other Ambulatory Visit: Payer: Self-pay

## 2018-03-23 ENCOUNTER — Ambulatory Visit
Admission: EM | Admit: 2018-03-23 | Discharge: 2018-03-23 | Disposition: A | Discharge status: Home / Self Care | Payer: BLUE CROSS/BLUE SHIELD | Attending: Family Medicine | Admitting: Family Medicine

## 2018-03-23 DIAGNOSIS — S92501A Displaced unspecified fracture of right lesser toe(s), initial encounter for closed fracture: Secondary | ICD-10-CM

## 2018-03-23 DIAGNOSIS — W2201XA Walked into wall, initial encounter: Secondary | ICD-10-CM | POA: Diagnosis not present

## 2018-03-23 DIAGNOSIS — S92502A Displaced unspecified fracture of left lesser toe(s), initial encounter for closed fracture: Secondary | ICD-10-CM

## 2018-03-23 NOTE — Discharge Instructions (Addendum)
Rest. Ice. Elevate. Keep buddy taped.   Follow up with your primary care physician or above in 1-2 weeks. Return to Urgent care for new or worsening concerns.

## 2018-03-23 NOTE — ED Provider Notes (Signed)
[facility] ____________________________________________  Time seen: Approximately 5:05 PM  I have reviewed the triage vital signs and the nursing notes.   HISTORY  Chief Complaint Toe Pain   HPI Martin Livingston is a 42 y.o. male presented for evaluation of right fifth toe pain post injury that occurred earlier today.  Patient states that he excellently stubbed his toe against the corner of the wall.  Denies any other pain or injuries.  States pain currently is mild, moderate with direct palpation or ambulation.  Denies fall to the ground.  No alleviating measures attempted prior to arrival.  Reports otherwise feels well.  Denies history of same. Denies recent sickness.    Past Medical History:  Diagnosis Date  . Headache   . 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   Past Surgical History:  Procedure Laterality Date  . LUMBAR LAMINECTOMY/DECOMPRESSION MICRODISCECTOMY Left 01/22/2017   Procedure: left L5-S1 microdiscectomy;  Surgeon: Meade Maw, MD;  Location: [facility] ORS;  Service: Neurosurgery;  Laterality: Left;  . NO PAST SURGERIES       No current facility-administered medications for this encounter.   Current Outpatient Medications:  .  gabapentin (NEURONTIN) 300 MG capsule, Take 600 mg by mouth at bedtime., Disp: , Rfl:  .  propranolol ER (INDERAL LA) 80 MG 24 hr capsule, Take 160 mg by mouth at bedtime., Disp: , Rfl:  .  meloxicam (MOBIC) 15 MG tablet, Take 15 mg by mouth daily with breakfast., Disp: , Rfl:   Allergies Patient has no known allergies.  Family History  Problem Relation Age of Onset  . Heart disease Father     Social History Social History   Tobacco Use  . Smoking status: Never Smoker  . Smokeless tobacco: Never Used  Substance Use Topics  . Alcohol use: Yes    Alcohol/week: 0.0 - 1.2 oz  . Drug use: No    Review of Systems Constitutional: No  fever/chills Cardiovascular: Denies chest pain. Respiratory: Denies shortness of breath. Musculoskeletal: Negative for back pain.as above.  Skin: Negative for rash.   ____________________________________________   PHYSICAL EXAM:  VITAL SIGNS: ED Triage Vitals  Enc Vitals Group     BP 03/23/18 1650 138/84     Pulse Rate 03/23/18 1650 79     Resp 03/23/18 1650 18     Temp 03/23/18 1650 98 F (36.7 C)     Temp Source 03/23/18 1650 Oral     SpO2 03/23/18 1650 99 %     Weight 03/23/18 1646 180 lb (81.6 kg)     Height 03/23/18 1646 6' (1.829 m)     Head Circumference --      Peak Flow --      Pain Score 03/23/18 1646 3     Pain Loc --      Pain Edu? --      Excl. in [place]? --     Constitutional: Alert and oriented. Well appearing and in no acute distress. ENT      Head: Normocephalic and atraumatic. Cardiovascular: Normal rate, regular rhythm. Grossly normal heart sounds.  Good peripheral circulation. Respiratory: Normal respiratory effort without tachypnea nor retractions. Breath sounds are clear and equal bilaterally. No wheezes, rales, rhonchi. Musculoskeletal: Bilateral pedal pulses equal and easily palpated. Steady gait. Except: Right fifth toe moderate tenderness to direct palpation, mild localized ecchymosis and edema, right foot otherwise nontender. Neurologic:  Normal speech and language. Speech  is normal. No gait instability.  Skin:  Skin is warm, dry and intact. No rash noted. Psychiatric: Mood and affect are normal. Speech and behavior are normal. Patient exhibits appropriate insight and judgment   ___________________________________________   LABS (all labs ordered are listed, but only abnormal results are displayed)  Labs Reviewed - No data to display ____________________________________________  RADIOLOGY  Dg Toe 5th Right  Result Date: 03/23/2018 CLINICAL DATA:  Initial encounter for trauma and pain. EXAM: RIGHT FIFTH TOE COMPARISON:  None. FINDINGS:  Oblique fracture ofm the proximal phalanx of the fifth digit with lateral displacement of distal fractured fragment. No intra-articular extension. IMPRESSION: Oblique fracture of the proximal phalanx of the fifth digit. Electronically Signed   By: Abigail Miyamoto M.D.   On: 03/23/2018 17:25   ____________________________________________   PROCEDURES Procedures   INITIAL IMPRESSION / ASSESSMENT AND PLAN / ED COURSE  Pertinent labs & imaging results that were available during my care of the patient were reviewed by me and considered in my medical decision making (see chart for details).  Well appearing patient.  No acute distress.  Right fifth toe x-ray as above per radiologist, oblique fracture of the proximal phalanx of the fifth digit.  Buddy taping applied.  Patient denies need for postoperative shoe.  Work note given for [employer].  Encouraged rest, ice, buddy taping and supportive care.  Denies need for prescription pain medication.  Follow-up with primary care or podiatry in 1 week.  Discussed follow up with Primary care physician this week. Discussed follow up and return parameters including no resolution or any worsening concerns. Patient verbalized understanding and agreed to plan.   ____________________________________________   FINAL CLINICAL IMPRESSION(S) / ED DIAGNOSES  Final diagnoses:  Closed fracture of phalanx of left fifth toe, initial encounter     ED Discharge Orders    None       Note: This dictation was prepared with Dragon dictation along with smaller phrase technology. Any transcriptional errors that result from this process are unintentional.         Marylene Land, NP 03/23/18 1907

## 2018-03-23 NOTE — ED Triage Notes (Signed)
Patient c/o tripping and hitting his right pinky toe on the wall today.

## 2018-06-16 ENCOUNTER — Ambulatory Visit
Admission: EM | Admit: 2018-06-16 | Discharge: 2018-06-16 | Disposition: A | Discharge status: Home / Self Care | Payer: BLUE CROSS/BLUE SHIELD | Attending: Family Medicine | Admitting: Family Medicine

## 2018-06-16 ENCOUNTER — Encounter: Payer: Self-pay | Admitting: Emergency Medicine

## 2018-06-16 ENCOUNTER — Other Ambulatory Visit: Payer: Self-pay

## 2018-06-16 DIAGNOSIS — W540XXA Bitten by dog, initial encounter: Secondary | ICD-10-CM | POA: Diagnosis not present

## 2018-06-16 DIAGNOSIS — S0185XA Open bite of other part of head, initial encounter: Secondary | ICD-10-CM | POA: Diagnosis not present

## 2018-06-16 MED ORDER — AMOXICILLIN-POT CLAVULANATE 875-125 MG PO TABS: 1.0000 | ORAL_TABLET | Freq: Two times a day (BID) | ORAL | 0 refills | Status: DC

## 2018-06-16 NOTE — ED Provider Notes (Signed)
[facility]    CSN: [id number] Arrival date & time: 06/16/18  1711  History   Chief Complaint Chief Complaint  Patient presents with  . Animal Bite    APPT   HPI  42 year old male presents with the above complaint.  Patient states that he was pulling a flea of his dog.  The dog does not like his ears to be messed with and this prompted him to bite his owner.  Patient states that he was bitten on the left cheek and the right side of the nose.  Mild bleeding.  Minimal bleeding currently.  Patient states that he is up-to-date on his immunizations as is his dog.  Animal control has been contacted by my staff.  No reported drainage.  Bleeding is controlled.  Patient is concerned about infection and thus presents for evaluation.  No other complaints.  Past Medical History:  Diagnosis Date  . Headache   . 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 LUMBAR LAMINECTOMY/DECOMPRESSION MICRODISCECTOMY Left 01/22/2017   Procedure: left L5-S1 microdiscectomy;  Surgeon: Meade Maw, MD;  Location: [facility] ORS;  Service: Neurosurgery;  Laterality: Left;  . NO PAST SURGERIES     Home Medications    Prior to Admission medications   Medication Sig Start Date End Date Taking? Authorizing Provider  gabapentin (NEURONTIN) 300 MG capsule Take 600 mg by mouth at bedtime.   Yes [provider]  propranolol ER (INDERAL LA) 80 MG 24 hr capsule Take 160 mg by mouth at bedtime.   Yes [provider]  amoxicillin-clavulanate (AUGMENTIN) 875-125 MG tablet Take 1 tablet by mouth every 12 (twelve) hours. 06/16/18   Coral Spikes, DO   Family History Family History  Problem Relation Age of Onset  . Heart disease Father    Social History Social History   Tobacco Use  . Smoking status: Never Smoker  . Smokeless tobacco: Never Used  Substance Use Topics  . Alcohol use: Yes    Alcohol/week: 0.0 - 2.0 standard drinks  . Drug  use: No     Allergies   Patient has no known allergies.   Review of Systems Review of Systems  Constitutional: Negative.   Skin: Positive for wound.   Physical Exam Triage Vital Signs ED Triage Vitals  Enc Vitals Group     BP 06/16/18 1727 (!) 155/100     Pulse Rate 06/16/18 1727 91     Resp 06/16/18 1727 18     Temp 06/16/18 1727 97.7 F (36.5 C)     Temp Source 06/16/18 1727 Oral     SpO2 06/16/18 1727 99 %     Weight 06/16/18 1725 170 lb (77.1 kg)     Height 06/16/18 1725 5\' 11"  (1.803 m)     Head Circumference --      Peak Flow --      Pain Score 06/16/18 1725 4     Pain Loc --      Pain Edu? --      Excl. in [place]? --    Updated Vital Signs BP (!) 155/100 (BP Location: Left Arm)   Pulse 91   Temp 97.7 F (36.5 C) (Oral)   Resp 18   Ht 5\' 11"  (1.803 m)   Wt 77.1 kg   SpO2 99%   BMI 23.71 kg/m   Visual Acuity Right Eye Distance:   Left Eye Distance:   Bilateral Distance:  Right Eye Near:   Left Eye Near:    Bilateral Near:     Physical Exam  Constitutional: He is oriented to person, place, and time. He appears well-developed. No distress.  HENT:  Patient has a linear superficial cut on the right nostril. Additionally, patient has a small puncture wound in the maxillary region on the left.  Eyes: Conjunctivae are normal. Right eye exhibits no discharge. Left eye exhibits no discharge.  Cardiovascular: Normal rate and regular rhythm.  Pulmonary/Chest: Effort normal and breath sounds normal. He has no wheezes. He has no rales.  Neurological: He is alert and oriented to person, place, and time.  Psychiatric: He has a normal mood and affect. His behavior is normal.  Nursing note and vitals reviewed.  UC Treatments / Results  Labs (all labs ordered are listed, but only abnormal results are displayed) Labs Reviewed - No data to display  EKG None  Radiology No results found.  Procedures Procedures (including critical care time)  Medications  Ordered in UC Medications - No data to display  Initial Impression / Assessment and Plan / UC Course  I have reviewed the triage vital signs and the nursing notes.  Pertinent labs & imaging results that were available during my care of the patient were reviewed by me and considered in my medical decision making (see chart for details).    42 year old male presents with a dog bite.  Wounds are superficial and small.  We discussed repair versus healing by secondary intention and elected for the latter as this is a dog bite/puncture wound.  Augmentin for prophylaxis.  Wounds were dressed today.  Animal control was contacted.  Final Clinical Impressions(s) / UC Diagnoses   Final diagnoses:  Dog bite of face, initial encounter     Discharge Instructions     Routine bathing.  Keep wounds clean.  Antibiotic as prescribed.  Take care  Dr. Lacinda Axon    ED Prescriptions    Medication Sig Dispense Auth. Provider   amoxicillin-clavulanate (AUGMENTIN) 875-125 MG tablet Take 1 tablet by mouth every 12 (twelve) hours. 14 tablet Coral Spikes, DO     Controlled Substance Prescriptions [place] Controlled Substance Registry consulted? Not Applicable   Coral Spikes, DO 06/16/18 1820

## 2018-06-16 NOTE — Discharge Instructions (Signed)
Routine bathing.  Keep wounds clean.  Antibiotic as prescribed.  Take care  Dr. Lacinda Axon

## 2018-06-16 NOTE — ED Triage Notes (Signed)
Patient c/o dog bite to his face, left side and right side of nose at 4:00pm today.

## 2020-06-06 ENCOUNTER — Encounter: Payer: Self-pay | Admitting: Urology

## 2020-06-06 ENCOUNTER — Other Ambulatory Visit: Payer: Self-pay

## 2020-06-06 ENCOUNTER — Ambulatory Visit: Payer: BC Managed Care – PPO | Admitting: Urology

## 2020-06-06 VITALS — BP 146/78 | HR 81 | Ht 71.0 in | Wt 176.0 lb

## 2020-06-06 DIAGNOSIS — N200 Calculus of kidney: Secondary | ICD-10-CM | POA: Diagnosis not present

## 2020-06-06 NOTE — Patient Instructions (Addendum)
Dietary Guidelines to Help Prevent Kidney Stones Kidney stones are deposits of minerals and salts that form inside your kidneys. Your risk of developing kidney stones may be greater depending on your diet, your lifestyle, the medicines you take, and whether you have certain medical conditions. Most people can reduce their chances of developing kidney stones by following the instructions below. Depending on your overall health and the type of kidney stones you tend to develop, your dietitian may give you more specific instructions. What are tips for following this plan? Reading food labels  Choose foods with "no salt added" or "low-salt" labels. Limit your sodium intake to less than 1500 mg per day.  Choose foods with calcium for each meal and snack. Try to eat about 300 mg of calcium at each meal. Foods that contain 200-500 mg of calcium per serving include: ? 8 oz (237 ml) of milk, fortified nondairy milk, and fortified fruit juice. ? 8 oz (237 ml) of kefir, yogurt, and soy yogurt. ? 4 oz (118 ml) of tofu. ? 1 oz of cheese. ? 1 cup (300 g) of dried figs. ? 1 cup (91 g) of cooked broccoli. ? 1-3 oz can of sardines or mackerel.  Most people need 1000 to 1500 mg of calcium each day. Talk to your dietitian about how much calcium is recommended for you. Shopping  Buy plenty of fresh fruits and vegetables. Most people do not need to avoid fruits and vegetables, even if they contain nutrients that may contribute to kidney stones.  When shopping for convenience foods, choose: ? Whole pieces of fruit. ? Premade salads with dressing on the side. ? Low-fat fruit and yogurt smoothies.  Avoid buying frozen meals or prepared deli foods.  Look for foods with live cultures, such as yogurt and kefir. Cooking  Do not add salt to food when cooking. Place a salt shaker on the table and allow each person to add his or her own salt to taste.  Use vegetable protein, such as beans, textured vegetable  protein (TVP), or tofu instead of meat in pasta, casseroles, and soups. Meal planning   Eat less salt, if told by your dietitian. To do this: ? Avoid eating processed or premade food. ? Avoid eating fast food.  Eat less animal protein, including cheese, meat, poultry, or fish, if told by your dietitian. To do this: ? Limit the number of times you have meat, poultry, fish, or cheese each week. Eat a diet free of meat at least 2 days a week. ? Eat only one serving each day of meat, poultry, fish, or seafood. ? When you prepare animal protein, cut pieces into small portion sizes. For most meat and fish, one serving is about the size of one deck of cards.  Eat at least 5 servings of fresh fruits and vegetables each day. To do this: ? Keep fruits and vegetables on hand for snacks. ? Eat 1 piece of fruit or a handful of berries with breakfast. ? Have a salad and fruit at lunch. ? Have two kinds of vegetables at dinner.  Limit foods that are high in a substance called oxalate. These include: ? Spinach. ? Rhubarb. ? Beets. ? Potato chips and french fries. ? Nuts.  If you regularly take a diuretic medicine, make sure to eat at least 1-2 fruits or vegetables high in potassium each day. These include: ? Avocado. ? Banana. ? Orange, prune, carrot, or tomato juice. ? Baked potato. ? Cabbage. ? Beans and split   peas. General instructions   Drink enough fluid to keep your urine clear or pale yellow. This is the most important thing you can do.  Talk to your health care provider and dietitian about taking daily supplements. Depending on your health and the cause of your kidney stones, you may be advised: ? Not to take supplements with vitamin C. ? To take a calcium supplement. ? To take a daily probiotic supplement. ? To take other supplements such as magnesium, fish oil, or vitamin B6.  Take all medicines and supplements as told by your health care provider.  Limit alcohol intake to no  more than 1 drink a day for nonpregnant women and 2 drinks a day for men. One drink equals 12 oz of beer, 5 oz of wine, or 1 oz of hard liquor.  Lose weight if told by your health care provider. Work with your dietitian to find strategies and an eating plan that works best for you. What foods are not recommended? Limit your intake of the following foods, or as told by your dietitian. Talk to your dietitian about specific foods you should avoid based on the type of kidney stones and your overall health. Grains Breads. Bagels. Rolls. Baked goods. Salted crackers. Cereal. Pasta. Vegetables Spinach. Rhubarb. Beets. Canned vegetables. Pickles. Olives. Meats and other protein foods Nuts. Nut butters. Large portions of meat, poultry, or fish. Salted or cured meats. Deli meats. Hot dogs. Sausages. Dairy Cheese. Beverages Regular soft drinks. Regular vegetable juice. Seasonings and other foods Seasoning blends with salt. Salad dressings. Canned soups. Soy sauce. Ketchup. Barbecue sauce. Canned pasta sauce. Casseroles. Pizza. Lasagna. Frozen meals. Potato chips. French fries. Summary  You can reduce your risk of kidney stones by making changes to your diet.  The most important thing you can do is drink enough fluid. You should drink enough fluid to keep your urine clear or pale yellow.  Ask your health care provider or dietitian how much protein from animal sources you should eat each day, and also how much salt and calcium you should have each day. This information is not intended to replace advice given to you by your health care provider. Make sure you discuss any questions you have with your health care provider. Document Revised: 02/03/2019 Document Reviewed: 09/24/2016 Elsevier Patient Education  2020 [employer]  [employer] Instructions [facility]  You will receive a box/kit in the mail that will have a urine jug and instructions in the kit.  When the box arrives  you will need to call our office [phone] to schedule a LAB appointment.  You will need to do a 24hour urine and this should be done during the days that our office will be open.  For example any day from Sunday through Thursday.  If you take Vitamin C 100mg or greater please stop this 5 days prior to collection.  How to collect the urine sample: On the day you start the urine sample this 1st morning urine should NOT be collected.  For the rest of the day including all night urines should be collected.  On the next morning the 1st urine should be collected and then you will be finished with the urine collections.  You will need to bring the box with you on your LAB appointment day after urine has been collected and all instructions are complete in the box.  Your blood will be drawn and the box will be collected by our Lab employee to be sent off   for analysis.  When urine and blood is complete you will need to schedule a follow up appointment for lab results.   

## 2020-06-06 NOTE — Progress Notes (Signed)
   06/06/20 1:36 PM   Jonathon Bellows March 30, 1976 [id number]  CC: Nephrolithiasis  HPI: I saw Mr. Stracener in urology clinic today for evaluation of recurrent nephrolithiasis.  He is a healthy 44 year old male who reportedly passed 3 stones in June.  He was seen in [place] when he was on vacation and his CT showed a 4 mm distal stone and 4 mm renal stone.  He passed both of those stones within 10 days, and he reports an additional smaller stone he passed a few weeks later.  He reports around 7 or 8 prior stone episodes total, none of which have required surgery previously.  He reports he drinks quite a bit of water during the day.  He has coffee in the morning and an energy drink overnight, but denies any soda use.  He tries to eat a low-salt diet, his high salt foods give him headaches.  He denies any high oxalate foods.  He is interested in further evaluation for his recurrent stone disease.  PTH and serum calcium both normal last week.  PMH: Past Medical History:  Diagnosis Date  . Headache   . 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 LUMBAR LAMINECTOMY/DECOMPRESSION MICRODISCECTOMY Left 01/22/2017   Procedure: left L5-S1 microdiscectomy;  Surgeon: Meade Maw, MD;  Location: [facility] ORS;  Service: Neurosurgery;  Laterality: Left;  . NO PAST SURGERIES      Family History: Family History  Problem Relation Age of Onset  . Heart disease Father     Social History:  reports that he has never smoked. He has never used smokeless tobacco. He reports current alcohol use. He reports that he does not use drugs.  Physical Exam: BP (!) 146/78   Pulse 81   Ht 5\' 11"  (1.803 m)   Wt 176 lb (79.8 kg)   BMI 24.55 kg/m    Constitutional:  Alert and oriented, No acute distress. Cardiovascular: No clubbing, cyanosis, or edema. Respiratory: Normal respiratory effort, no increased work of breathing. GI: Abdomen is soft,  nontender, nondistended, no abdominal masses GU: No CVA tenderness  Laboratory Data: Reviewed  Pertinent Imaging: CT report from June 2020 reviewed reporting a 4 mm right UVJ stone and 4 mm right renal stone, both of which the patient has passed spontaneously in the interim  Assessment & Plan:   Healthy 44 year old male with recurrent nephrolithiasis including 7-8 total stone episodes, and 3 episodes in the last 2 months.  Based on CT from June 2020, no current stone burden.  We discussed general stone prevention strategies including adequate hydration with goal of producing 2.5 L of urine daily, increasing citric acid intake, increasing calcium intake during high oxalate meals, minimizing animal protein, and decreasing salt intake. Information about dietary recommendations given today.  I recommended further evaluation with a 58-IFOY urine metabolic work-up, and he is in agreement.  [id number] urine metabolic work-up, follow-up in 6 weeks to discuss results  Nickolas Madrid, MD 06/06/2020  [facility] [address] [place], [place] [address] [phone]

## 2020-07-18 ENCOUNTER — Ambulatory Visit: Payer: BC Managed Care – PPO | Admitting: Urology

## 2020-11-20 ENCOUNTER — Other Ambulatory Visit: Payer: Self-pay

## 2020-11-20 DIAGNOSIS — Z20822 Contact with and (suspected) exposure to covid-19: Secondary | ICD-10-CM

## 2020-11-21 LAB — NOVEL CORONAVIRUS, NAA: SARS-CoV-2, NAA: NOT DETECTED

## 2020-11-21 LAB — SARS-COV-2, NAA 2 DAY TAT
